# Patient Record
Sex: Female | Born: 1975 | Race: Black or African American | Hispanic: No | Marital: Married | Smoking: Former smoker
Health system: Southern US, Community
[De-identification: ages and names within clinical notes are randomized; demographics above are authoritative.]

## PROBLEM LIST (undated history)

## (undated) DIAGNOSIS — F419 Anxiety disorder, unspecified: Secondary | ICD-10-CM

## (undated) HISTORY — DX: Anxiety disorder, unspecified: F41.9

## (undated) HISTORY — PX: MOUTH SURGERY: SHX715

---

## 2011-09-20 ENCOUNTER — Inpatient Hospital Stay (INDEPENDENT_AMBULATORY_CARE_PROVIDER_SITE_OTHER)
Admission: RE | Admit: 2011-09-20 | Discharge: 2011-09-20 | Disposition: A | Discharge status: Home / Self Care | Payer: Self-pay | Source: Ambulatory Visit | Attending: Emergency Medicine | Admitting: Emergency Medicine

## 2011-09-20 ENCOUNTER — Encounter: Payer: Self-pay | Admitting: Emergency Medicine

## 2011-09-20 DIAGNOSIS — M940 Chondrocostal junction syndrome [Tietze]: Secondary | ICD-10-CM

## 2011-10-24 NOTE — Progress Notes (Signed)
Summary: back ache/chest pain   Vital Signs:  Patient Profile:   35 Years Old Female CC:      back and chest pain x yesterday Height:     60 inches Weight:      151 pounds O2 Sat:      99 % O2 treatment:    Room Air Temp:     98.4 degrees F oral Pulse rate:   80 / minute Resp:     16 per minute BP sitting:   120 / 79  (left arm) Cuff size:   regular  Pt. in pain?   yes    Location:   right upper back to right chest    Type:       sharp  Vitals Entered By: Lajean Saver RN (September 20, 2011 8:17 AM)                   Updated Prior Medication List: CIPRO 500 MG TABS (CIPROFLOXACIN HCL) taking for 2 days for UTI  Current Allergies: No known allergies History of Present Illness Chief Complaint: back and chest pain x yesterday History of Present Illness: 1 day of back and R chest pain.  It seems to wrap around her.  It hurt to take a deep breath, but today feels better.  Bending forward or leaning to the L makes it worse.  She doesn't recall any heavy lifting/pushing/pulling other than her laundry a few days ago.  No other trauma.  No L sided CP, no SOB, no rashes.  Her parents are alive, she does not smoke, no h/o HTN or other major medical concerns.  She has been on Cipro for a few days for a UTI.  Last night when she felt this way, she states that she got so nervous and because nauseated and threw up twice.  She also had diarrhea twice.  This morning hasn't had any problems.  REVIEW OF SYSTEMS Constitutional Symptoms      Denies fever, chills, night sweats, weight loss, weight gain, and fatigue.  Eyes       Denies change in vision, eye pain, eye discharge, glasses, contact lenses, and eye surgery. Ear/Nose/Throat/Mouth       Denies hearing loss/aids, change in hearing, ear pain, ear discharge, dizziness, frequent runny nose, frequent nose bleeds, sinus problems, sore throat, hoarseness, and tooth pain or bleeding.  Respiratory       Denies dry cough, productive cough,  wheezing, shortness of breath, asthma, bronchitis, and emphysema/COPD.  Cardiovascular       Complains of chest pain.      Denies murmurs and tires easily with exhertion.      Comments: sharp   Gastrointestinal       Complains of nausea/vomiting and diarrhea.      Denies stomach pain, constipation, blood in bowel movements, and indigestion. Genitourniary       Denies painful urination, blood or discharge from vagina, kidney stones, and loss of urinary control. Neurological       Denies paralysis, seizures, and fainting/blackouts. Musculoskeletal       Complains of muscle pain.      Denies joint pain, joint stiffness, decreased range of motion, redness, swelling, muscle weakness, and gout.  Skin       Denies bruising, unusual mles/lumps or sores, and hair/skin or nail changes.  Psych       Denies mood changes, temper/anger issues, anxiety/stress, speech problems, depression, and sleep problems. Other Comments: Patinet c/o sharp right upper back  pain that radiates to her right breast. This started yesterday along with one episode of N/V/D. She has been on Cipro for 2 days for a UTI   Past History:  Past Medical History: Unremarkable  Past Surgical History: Denies surgical history  Social History: Married Never Smoked Alcohol use-no Drug use-no Smoking Status:  never Drug Use:  no Physical Exam General appearance: well developed, well nourished, no acute distress Chest/Lungs: no rales, wheezes, or rhonchi bilateral, breath sounds equal without effort Heart: regular rate and  rhythm, no murmur Abdomen: soft, non-tender without obvious organomegaly, neg murphys' and mcburney's, non distended, +BS Back: No flank or CVA tenderness MSE: oriented to time, place, and person Mild chest wall tenderness following the circumference of thw  R 7th rib.  The is no hyperesthesia, no rash or other lesions.  AP and Lat squeezing provides some relief for her. Assessment New  Problems: COSTOCHONDRITIS (ICD-733.6)   Plan New Medications/Changes: FLEXERIL 5 MG TABS (CYCLOBENZAPRINE HCL) 1 by mouth up to three times a day as needed for muscle spasms  #15 x 0, 09/20/2011, Hoyt Koch MD TRAMADOL HCL 50 MG TABS (TRAMADOL HCL) 1 by mouth q6 hrs as needed for pain  #20 x 0, 09/20/2011, Hoyt Koch MD  New Orders: New Patient Level I [phone] Planning Comments:   DDx includes costochondritis, chest wall strain, early shingles.  Will treat with tramadol & flexeril, heating pad, stretches.  Info sheet given.  If worsening symptoms, should call clinic to discuss.  I don't see any red flags for cardiac issues.  However, ER precautions are discussed for further CP or symptoms.  If she continues taking the Cipro and becomes nauseated again, that is likely the cause and she needs to call her PCP to discuss.  If worsening diarrhea, consider C.dif workup and treatment.   The patient and/or caregiver has been counseled thoroughly with regard to medications prescribed including dosage, schedule, interactions, rationale for use, and possible side effects and they verbalize understanding.  Diagnoses and expected course of recovery discussed and will return if not improved as expected or if the condition worsens. Patient and/or caregiver verbalized understanding.  Prescriptions: FLEXERIL 5 MG TABS (CYCLOBENZAPRINE HCL) 1 by mouth up to three times a day as needed for muscle spasms  #15 x 0   Entered and Authorized by:   Hoyt Koch MD   Signed by:   Hoyt Koch MD on 09/20/2011   Method used:   Print then Give to Patient   RxID:   [id number] TRAMADOL HCL 50 MG TABS (TRAMADOL HCL) 1 by mouth q6 hrs as needed for pain  #20 x 0   Entered and Authorized by:   Hoyt Koch MD   Signed by:   Hoyt Koch MD on 09/20/2011   Method used:   Print then Give to Patient   RxID:   [id number]   Orders Added: 1)  New Patient Level I [29528]

## 2014-12-24 ENCOUNTER — Encounter: Payer: Self-pay | Admitting: Obstetrics & Gynecology

## 2014-12-24 ENCOUNTER — Ambulatory Visit (INDEPENDENT_AMBULATORY_CARE_PROVIDER_SITE_OTHER): Payer: BLUE CROSS/BLUE SHIELD | Admitting: Obstetrics & Gynecology

## 2014-12-24 VITALS — BP 128/75 | HR 88 | Resp 16 | Ht 60.0 in | Wt 146.0 lb

## 2014-12-24 DIAGNOSIS — Z124 Encounter for screening for malignant neoplasm of cervix: Secondary | ICD-10-CM

## 2014-12-24 DIAGNOSIS — Z3042 Encounter for surveillance of injectable contraceptive: Secondary | ICD-10-CM | POA: Diagnosis not present

## 2014-12-24 DIAGNOSIS — Z113 Encounter for screening for infections with a predominantly sexual mode of transmission: Secondary | ICD-10-CM | POA: Diagnosis not present

## 2014-12-24 DIAGNOSIS — Z1151 Encounter for screening for human papillomavirus (HPV): Secondary | ICD-10-CM

## 2014-12-24 DIAGNOSIS — Z01419 Encounter for gynecological examination (general) (routine) without abnormal findings: Secondary | ICD-10-CM | POA: Diagnosis not present

## 2014-12-24 DIAGNOSIS — Z0000 Encounter for general adult medical examination without abnormal findings: Secondary | ICD-10-CM

## 2014-12-24 LAB — COMPREHENSIVE METABOLIC PANEL
ALK PHOS: 68 U/L (ref 39–117)
ALT: <8 U/L (ref 0–35)
AST: 10 U/L (ref 0–37)
Albumin: 4.2 g/dL (ref 3.5–5.2)
BILIRUBIN TOTAL: 0.3 mg/dL (ref 0.2–1.2)
BUN: 14 mg/dL (ref 6–23)
CALCIUM: 8.9 mg/dL (ref 8.4–10.5)
CHLORIDE: 106 meq/L (ref 96–112)
CO2: 22 mEq/L (ref 19–32)
Creat: 0.72 mg/dL (ref 0.50–1.10)
GLUCOSE: 89 mg/dL (ref 70–99)
Potassium: 4.3 mEq/L (ref 3.5–5.3)
SODIUM: 142 meq/L (ref 135–145)
Total Protein: 6.7 g/dL (ref 6.0–8.3)

## 2014-12-24 LAB — CBC
HCT: 38.2 % (ref 36.0–46.0)
HEMOGLOBIN: 12.7 g/dL (ref 12.0–15.0)
MCH: 30.5 pg (ref 26.0–34.0)
MCHC: 33.2 g/dL (ref 30.0–36.0)
MCV: 91.6 fL (ref 78.0–100.0)
MPV: 11.8 fL (ref 8.6–12.4)
Platelets: 317 10*3/uL (ref 150–400)
RBC: 4.17 MIL/uL (ref 3.87–5.11)
RDW: 13.6 % (ref 11.5–15.5)
WBC: 13 10*3/uL — AB (ref 4.0–10.5)

## 2014-12-24 LAB — LIPID PANEL
Cholesterol: 162 mg/dL (ref 0–200)
HDL: 39 mg/dL — AB (ref >39)
LDL Cholesterol: 111 mg/dL — ABNORMAL HIGH (ref 0–99)
TRIGLYCERIDES: 61 mg/dL (ref <150)
Total CHOL/HDL Ratio: 4.2 Ratio
VLDL: 12 mg/dL (ref 0–40)

## 2014-12-24 MED ORDER — MEDROXYPROGESTERONE ACETATE 150 MG/ML IM SUSP
150.0000 mg | Freq: Once | INTRAMUSCULAR | Status: AC
  Administered 2014-12-24: 150 mg via INTRAMUSCULAR

## 2014-12-24 NOTE — Progress Notes (Signed)
Subjective:    Kirsten Morgan is a 39 y.o. separated AA P3 (37 yo adopted out daughter, 32 yo daughter at [place], 23 yo son)  female who presents for an annual exam. The patient has no complaints today. She wants some form of contraception although she did not conceive for 2+years of trying. Periods are monthly, last 4 days.Depo caused her weight gain. She is not ready for a BTL. She is a smoker. The patient is sexually active. She is using condoms. GYN screening history: last pap: was normal. The patient wears seatbelts: yes. The patient participates in regular exercise: yes. Has the patient ever been transfused or tattooed?: yes. The patient reports that there is not domestic violence in her life.   Menstrual History: OB History    Gravida Para Term Preterm AB TAB SAB Ectopic Multiple Living   0   3      Menarche age: 17  Patient's last menstrual period was 12/11/2014.    The following portions of the patient's history were reviewed and updated as appropriate: allergies, current medications, past family history, past medical history, past social history, past surgical history and problem list.  Review of Systems A comprehensive review of systems was negative.    Objective:    BP 128/75 mmHg  Pulse 88  Resp 16  Ht 5' (1.524 m)  Wt 146 lb (66.225 kg)  BMI 28.51 kg/m2  LMP 12/11/2014  General Appearance:    Alert, cooperative, no distress, appears stated age  Head:    Normocephalic, without obvious abnormality, atraumatic  Eyes:    PERRL, conjunctiva/corneas clear, EOM's intact, fundi    benign, both eyes  Ears:    Normal TM's and external ear canals, both ears  Nose:   Nares normal, septum midline, mucosa normal, no drainage    or sinus tenderness  Throat:   Lips, mucosa, and tongue normal; teeth and gums normal  Neck:   Supple, symmetrical, trachea midline, no adenopathy;    thyroid:  no enlargement/tenderness/nodules; no carotid   bruit or JVD  Back:      Symmetric, no curvature, ROM normal, no CVA tenderness  Lungs:     Clear to auscultation bilaterally, respirations unlabored  Chest Wall:    No tenderness or deformity   Heart:    Regular rate and rhythm, S1 and S2 normal, no murmur, rub   or gallop  Breast Exam:    No tenderness, masses, or nipple abnormality  Abdomen:     Soft, non-tender, bowel sounds active all four quadrants,    no masses, no organomegaly  Genitalia:    Normal female without lesion, discharge or tenderness, 8 week size, NT, mobile uterus, normal adnexal exam     Extremities:   Extremities normal, atraumatic, no cyanosis or edema  Pulses:   2+ and symmetric all extremities  Skin:   Skin color, texture, turgor normal, no rashes or lesions  Lymph nodes:   Cervical, supraclavicular, and axillary nodes normal  Neurologic:   CNII-XII intact, normal strength, sensation and reflexes    throughout  .    Assessment:    Healthy female exam.   Contraception Probable fibroids   Plan:     Breast self exam technique reviewed and patient encouraged to perform self-exam monthly. Thin prep Pap smear.   STI testing per request Restart depo provera. She will get info on [facility] Rec stop smoking She declines a flu vaccine today I offered watchful  waiting versus u/s to evaluate her uterus. Since she has no gyn complaints, she would prefer just to keep an eye on them RTC 1 year/prn sooner

## 2014-12-24 NOTE — Addendum Note (Signed)
Addended by: Granville LewisLARK, LORA L on: 12/24/2014 11:32 AM   Modules accepted: Orders

## 2014-12-25 ENCOUNTER — Telehealth: Payer: Self-pay | Admitting: *Deleted

## 2014-12-25 LAB — RPR

## 2014-12-25 LAB — HEPATITIS B SURFACE ANTIGEN: HEP B S AG: NEGATIVE

## 2014-12-25 LAB — TSH: TSH: 0.583 u[IU]/mL (ref 0.350–4.500)

## 2014-12-25 LAB — HEPATITIS C ANTIBODY: HCV AB: NEGATIVE

## 2014-12-25 LAB — HIV ANTIBODY (ROUTINE TESTING W REFLEX): HIV: NONREACTIVE

## 2014-12-25 NOTE — Telephone Encounter (Signed)
LM on vociemail of labwork and will mail a copy per pt's request.

## 2014-12-29 LAB — CYTOLOGY - PAP

## 2015-03-19 ENCOUNTER — Ambulatory Visit (INDEPENDENT_AMBULATORY_CARE_PROVIDER_SITE_OTHER): Payer: BLUE CROSS/BLUE SHIELD | Admitting: *Deleted

## 2015-03-19 ENCOUNTER — Encounter: Payer: Self-pay | Admitting: *Deleted

## 2015-03-19 VITALS — BP 122/78 | HR 84 | Resp 16 | Ht 60.0 in | Wt 146.0 lb

## 2015-03-19 DIAGNOSIS — Z3042 Encounter for surveillance of injectable contraceptive: Secondary | ICD-10-CM

## 2015-03-19 MED ORDER — MEDROXYPROGESTERONE ACETATE 150 MG/ML IM SUSP
150.0000 mg | INTRAMUSCULAR | Status: AC
  Administered 2015-03-19 – 2016-02-15 (×3): 150 mg via INTRAMUSCULAR

## 2015-06-11 ENCOUNTER — Ambulatory Visit: Payer: BLUE CROSS/BLUE SHIELD

## 2015-06-15 ENCOUNTER — Ambulatory Visit: Payer: BLUE CROSS/BLUE SHIELD

## 2015-06-25 ENCOUNTER — Encounter: Payer: Self-pay | Admitting: Obstetrics & Gynecology

## 2015-06-25 ENCOUNTER — Ambulatory Visit (INDEPENDENT_AMBULATORY_CARE_PROVIDER_SITE_OTHER): Payer: BLUE CROSS/BLUE SHIELD | Admitting: Obstetrics & Gynecology

## 2015-06-25 VITALS — BP 103/69 | HR 80 | Resp 16 | Ht 61.0 in | Wt 144.0 lb

## 2015-06-25 DIAGNOSIS — N938 Other specified abnormal uterine and vaginal bleeding: Secondary | ICD-10-CM | POA: Diagnosis not present

## 2015-06-25 DIAGNOSIS — Z3042 Encounter for surveillance of injectable contraceptive: Secondary | ICD-10-CM | POA: Diagnosis not present

## 2015-06-25 NOTE — Progress Notes (Signed)
   Subjective:    Patient ID: Kirsten Morgan, female    DOB: April 22, 1976, 39 y.o.   MRN: [id number]  HPI 39 yo lady here for her next depo provera and ER follow up (seen yesterday at [facility]) for 3 weeks of bleeding, heavy. Her hemoglobin was 12.5. We know that she has fibroids.   Review of Systems     Objective:   Physical Exam [id number] Breathing, conversing, and ambulating normally       Assessment & Plan:  Fibroids- continue depo provera Rec [facility]

## 2015-06-29 ENCOUNTER — Ambulatory Visit (INDEPENDENT_AMBULATORY_CARE_PROVIDER_SITE_OTHER): Payer: BLUE CROSS/BLUE SHIELD

## 2015-06-29 DIAGNOSIS — N938 Other specified abnormal uterine and vaginal bleeding: Secondary | ICD-10-CM

## 2015-06-29 DIAGNOSIS — N8320 Unspecified ovarian cysts: Secondary | ICD-10-CM

## 2015-07-06 ENCOUNTER — Ambulatory Visit (INDEPENDENT_AMBULATORY_CARE_PROVIDER_SITE_OTHER): Payer: BLUE CROSS/BLUE SHIELD | Admitting: Obstetrics & Gynecology

## 2015-07-06 VITALS — BP 120/81 | HR 63 | Resp 16 | Ht 61.0 in | Wt 144.0 lb

## 2015-07-06 DIAGNOSIS — Q899 Congenital malformation, unspecified: Secondary | ICD-10-CM

## 2015-07-06 DIAGNOSIS — R938 Abnormal findings on diagnostic imaging of other specified body structures: Secondary | ICD-10-CM | POA: Diagnosis not present

## 2015-07-06 DIAGNOSIS — R9341 Abnormal radiologic findings on diagnostic imaging of renal pelvis, ureter, or bladder: Secondary | ICD-10-CM

## 2015-07-06 NOTE — Progress Notes (Signed)
   Subjective:    Patient ID: Kirsten Morgan, female    DOB: [birth year], 39 y.o.   MRN: [id number]  HPI  She is here for follow up of her gyn u/s.   Review of Systems     Objective:   Physical Exam WNWHBFNAD Breathing, conversing, and ambulating normally       Assessment & Plan:  Filling defect in the bladder- refer to urology

## 2015-11-02 ENCOUNTER — Other Ambulatory Visit (INDEPENDENT_AMBULATORY_CARE_PROVIDER_SITE_OTHER): Payer: BLUE CROSS/BLUE SHIELD | Admitting: *Deleted

## 2015-11-02 DIAGNOSIS — Z01812 Encounter for preprocedural laboratory examination: Secondary | ICD-10-CM | POA: Diagnosis not present

## 2015-11-02 LAB — POCT URINE PREGNANCY: PREG TEST UR: NEGATIVE

## 2015-11-02 NOTE — Progress Notes (Signed)
Pt here for UPT because her last Depo Provera was 07/06/15.  UPT today is neg, she will return in 2 weeks with no intercourse or using protection for another UPT and if neg will give Depo Provera 150 mg

## 2015-11-17 ENCOUNTER — Other Ambulatory Visit (INDEPENDENT_AMBULATORY_CARE_PROVIDER_SITE_OTHER): Payer: BLUE CROSS/BLUE SHIELD | Admitting: *Deleted

## 2015-11-17 VITALS — BP 103/62 | HR 68 | Resp 16 | Ht 60.0 in | Wt 148.0 lb

## 2015-11-17 DIAGNOSIS — Z3042 Encounter for surveillance of injectable contraceptive: Secondary | ICD-10-CM

## 2015-11-17 DIAGNOSIS — N938 Other specified abnormal uterine and vaginal bleeding: Secondary | ICD-10-CM | POA: Diagnosis not present

## 2015-11-17 DIAGNOSIS — Z01812 Encounter for preprocedural laboratory examination: Secondary | ICD-10-CM | POA: Diagnosis not present

## 2015-11-17 LAB — POCT URINE PREGNANCY: Preg Test, Ur: NEGATIVE

## 2015-11-17 NOTE — Progress Notes (Signed)
Pt here for a urine preg test and Depo Provera.  Pt is 6 weeks overdue for Depo Provera.  LMP 11/15/15 and UPT today is neg.  Depo provera 150 mg given IM LD

## 2015-12-03 IMAGING — US US TRANSVAGINAL NON-OB
1 series · 13 of 25 positions shown · non-contrast
Comparison: None

CLINICAL DATA: Dysfunctional uterine bleeding.

EXAM:
TRANSABDOMINAL AND TRANSVAGINAL ULTRASOUND OF PELVIS
TECHNIQUE: Both transabdominal and transvaginal ultrasound examinations of the
pelvis were performed. Transabdominal technique was performed for
global imaging of the pelvis including uterus, ovaries, adnexal
regions, and pelvic cul-de-sac. It was necessary to proceed with
endovaginal exam following the transabdominal exam to visualize the
uterus, endometrium and ovaries..

[Series 1: us transvaginal non-ob · 0.20mm/px · 13 of 148 slices shown]
[im 1/148]
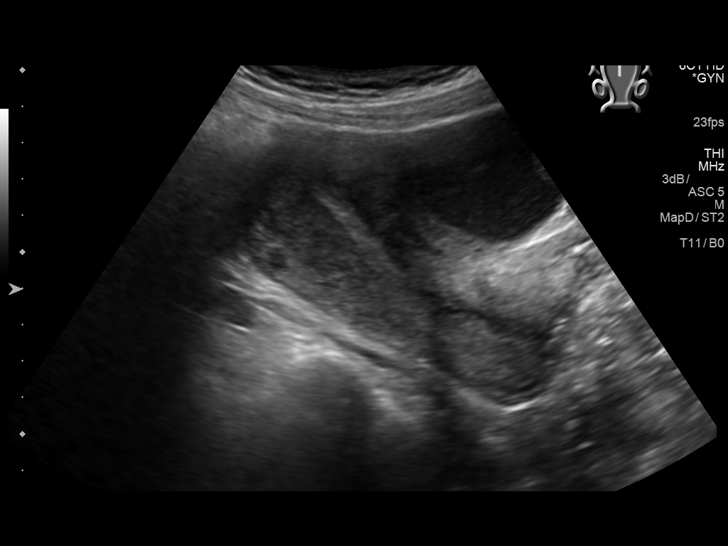
[im 13/148]
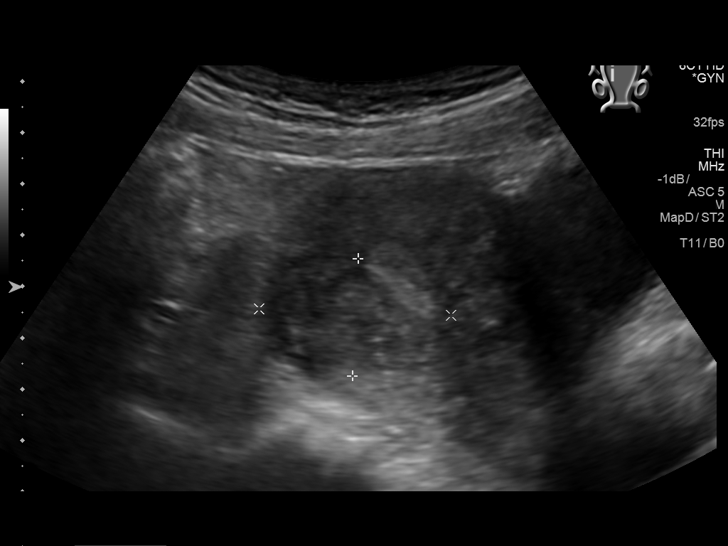
[im 25/148]
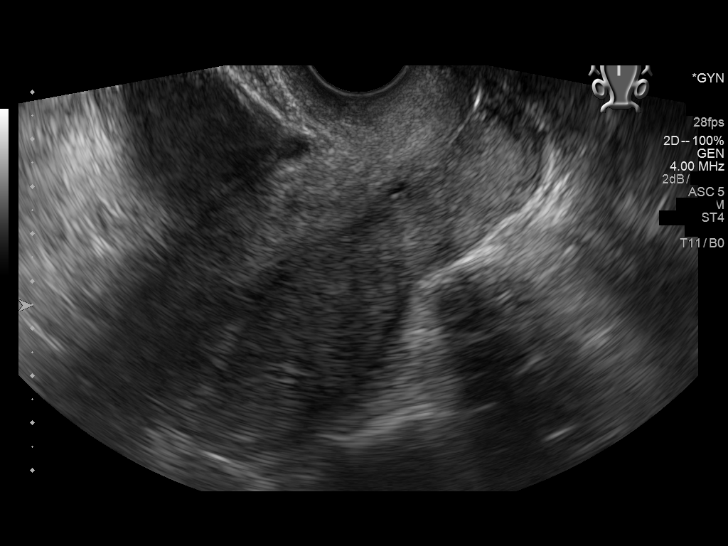
[im 37/148]
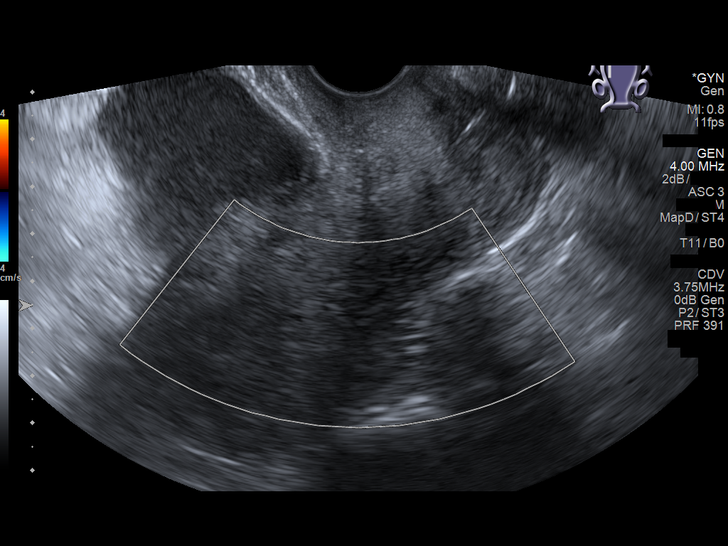
[im 50/148]
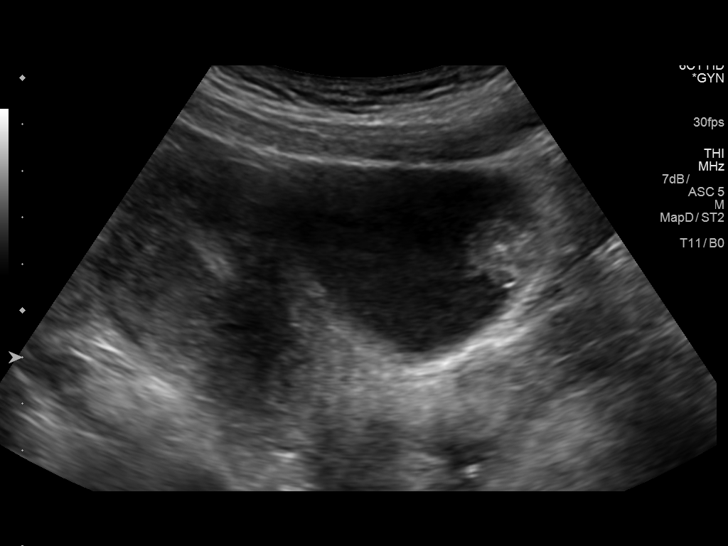
[im 62/148]
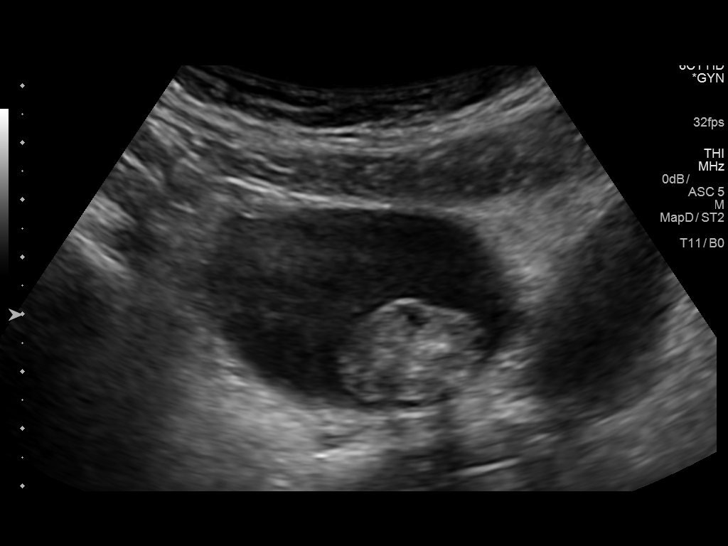
[im 74/148]
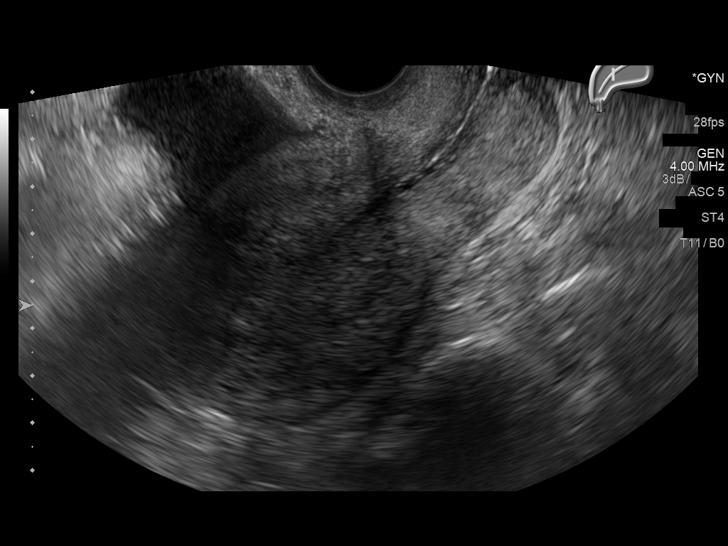
[im 86/148]
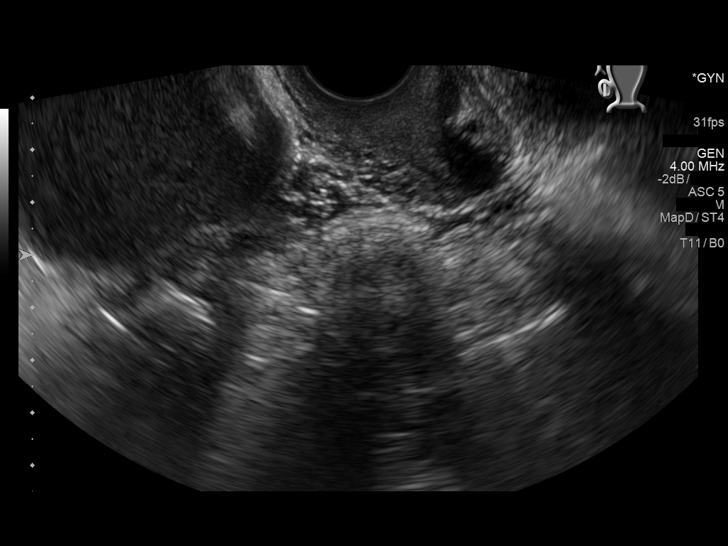
[im 99/148]
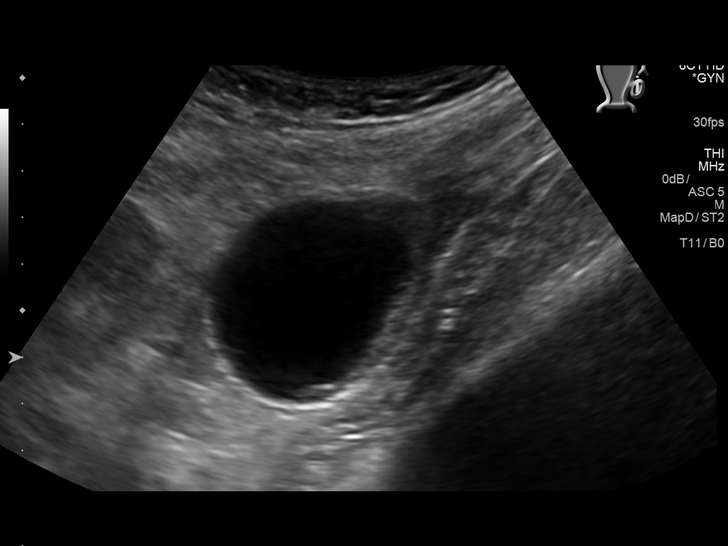
[im 111/148]
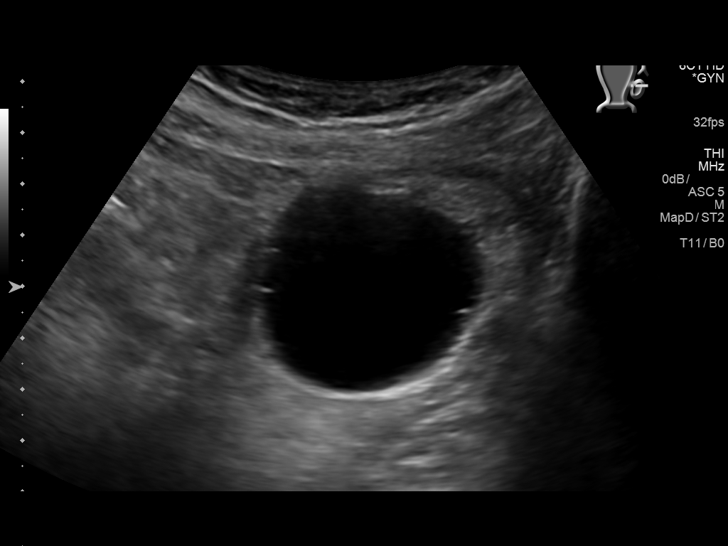
[im 123/148]
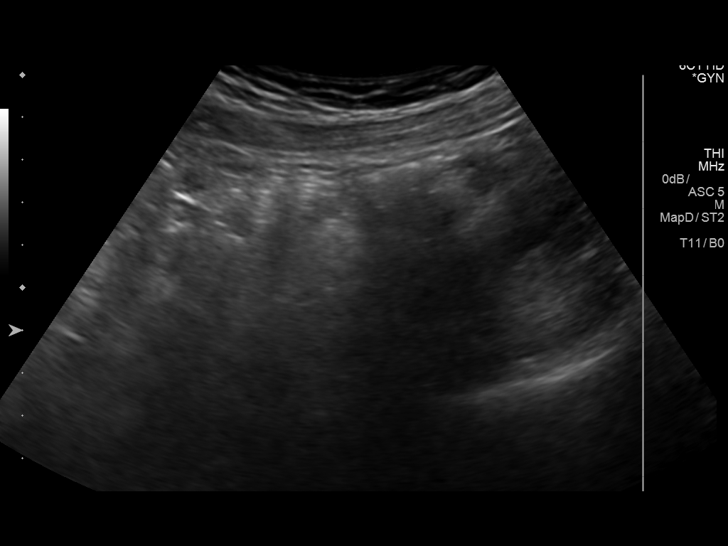
[im 135/148]
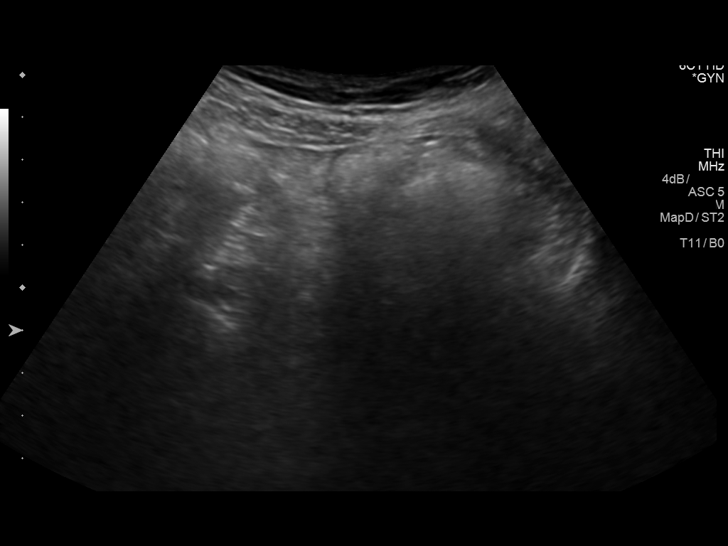
[im 148/148]
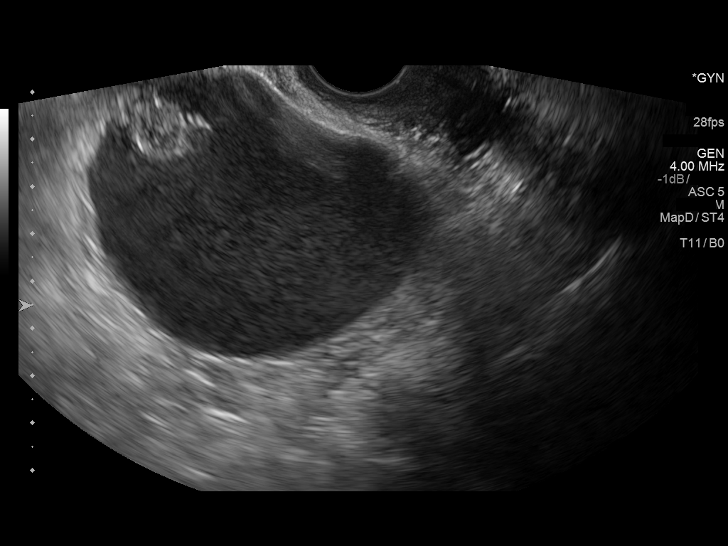

[13 of 25 positions shown; findings below may reference images not displayed]

FINDINGS: Uterus

Measurements: 10.1 x 4.6 x 5.9 cm. Posterior fundal fibroid measures
3.6 x 3.1 x 3.8 cm.

Endometrium

Thickness: 8 mm.  No focal abnormality visualized.

Right ovary

Measurements: 2.9 x 1.9 x 2.0 cm.. Normal appearance/no adnexal
mass.

Left ovary

Measurements: 6.7 x 4.2 x 5.8 cm. Large cyst is noted measuring
x 4.2 x 4.3 cm.

Other findings

There is head a solid-appearing she structure within the urinary
bladder measuring 2 x 1.9 x 2.5 cm. This is indeterminate and may
contain some internal areas of calcification.
IMPRESSION: 1. Normal appearance of the endometrium. If bleeding remains
unresponsive to hormonal or medical therapy, sonohysterogram should
be considered for focal lesion work-up. (Ref: Radiological
Reasoning: Algorithmic Workup of Abnormal Vaginal Bleeding with
Endovaginal Sonography and Sonohysterography. AJR [id number]; 191:S68-73)
2. Solid-appearing filling defect within the urinary bladder is
nonspecific. Urothelial lesion cannot be excluded. Recommend further
evaluation with hematuria protocol CT of the abdomen and pelvis.
3. Left ovary cyst. This is almost certainly benign, and no specific
imaging follow up is recommended according to the Society of
Radiologists in Kltrasound3NBN Consensus Conference Statement (TIGER
Zadi et al. Management of Asymptomatic Ovarian and Other Adnexal
Cysts Imaged at US: Society of Radiologists in Ultrasound Consensus

## 2016-02-09 ENCOUNTER — Ambulatory Visit: Payer: BLUE CROSS/BLUE SHIELD

## 2016-02-15 ENCOUNTER — Ambulatory Visit (INDEPENDENT_AMBULATORY_CARE_PROVIDER_SITE_OTHER): Payer: BLUE CROSS/BLUE SHIELD | Admitting: *Deleted

## 2016-02-15 DIAGNOSIS — Z3042 Encounter for surveillance of injectable contraceptive: Secondary | ICD-10-CM

## 2016-02-15 DIAGNOSIS — N938 Other specified abnormal uterine and vaginal bleeding: Secondary | ICD-10-CM | POA: Diagnosis not present

## 2016-05-09 ENCOUNTER — Ambulatory Visit: Payer: BLUE CROSS/BLUE SHIELD | Admitting: *Deleted
